# Patient Record
Sex: Female | Born: 2000 | Race: White | Hispanic: No | Marital: Single | Smoking: Never smoker
Health system: Southern US, Community
[De-identification: ages and names within clinical notes are randomized; demographics above are authoritative.]

## PROBLEM LIST (undated history)

## (undated) ENCOUNTER — Emergency Department (HOSPITAL_BASED_OUTPATIENT_CLINIC_OR_DEPARTMENT_OTHER): Admission: EM | Payer: Self-pay | Source: Home / Self Care

## (undated) DIAGNOSIS — F419 Anxiety disorder, unspecified: Secondary | ICD-10-CM

## (undated) DIAGNOSIS — F329 Major depressive disorder, single episode, unspecified: Secondary | ICD-10-CM

## (undated) DIAGNOSIS — F32A Depression, unspecified: Secondary | ICD-10-CM

## (undated) HISTORY — DX: Anxiety disorder, unspecified: F41.9

## (undated) HISTORY — DX: Depression, unspecified: F32.A

---
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
## 2022-02-16 ENCOUNTER — Telehealth: Payer: Self-pay

## 2022-02-16 ENCOUNTER — Telehealth (INDEPENDENT_AMBULATORY_CARE_PROVIDER_SITE_OTHER): Payer: 59 | Admitting: Child and Adolescent Psychiatry

## 2022-02-16 DIAGNOSIS — F3181 Bipolar II disorder: Secondary | ICD-10-CM | POA: Diagnosis not present

## 2022-02-16 DIAGNOSIS — F419 Anxiety disorder, unspecified: Secondary | ICD-10-CM

## 2022-02-16 MED ORDER — BUPROPION HCL ER (XL) 300 MG PO TB24
300.0000 mg | ORAL_TABLET | Freq: Every day | ORAL | 1 refills | Status: DC
Start: 2022-02-16 — End: 2022-06-01

## 2022-02-16 MED ORDER — ARIPIPRAZOLE 5 MG PO TABS: 5.0000 mg | ORAL_TABLET | Freq: Every day | ORAL | 1 refills | Status: AC

## 2022-02-16 MED ORDER — SERTRALINE HCL 100 MG PO TABS: 100.0000 mg | ORAL_TABLET | Freq: Every day | ORAL | 1 refills | Status: AC

## 2022-02-16 NOTE — Progress Notes (Signed)
Virtual Visit via Video Note   I connected with Tammy Norman on 02/16/22 at 11:00 AM EDT by a video enabled telemedicine application and verified that I am speaking with the correct person using two identifiers.  Location: Patient: home Provider: office   I discussed the limitations of evaluation and management by telemedicine and the availability of in person appointments. The patient expressed understanding and agreed to proceed.    I discussed the assessment and treatment plan with the patient. The patient was provided an opportunity to ask questions and all were answered. The patient agreed with the plan and demonstrated an understanding of the instructions.   The patient was advised to call back or seek an in-person evaluation if the symptoms worsen or if the condition fails to improve as anticipated.   Tammy Erm, MD   [facility] MD/PA/NP OP Progress Note  02/16/2022 11:16 AM [name]  MRN:  [id number]  Chief Complaint: "doing good.."(pt)  HPI:   This is a 21 year old female, rising sophomore at [employer], with no significant medical history and psychiatric history significant of bipolar 2 disorder and generalized anxiety disorder, was previously seeing Dr. Toy Care for medication management since 2020, presented to establish outpatient psychiatric treatment at this clinic in 05/2021 as Dr. Toy Care has left the practice.   She last saw Dr. Toy Care in 12/2020, she subsequently transitioned her treatment to this clinic in November 2022.  She was last seen about 6 weeks ago and at that time she was recommended to continue taking Zoloft 100 mg once a day, and Wellbutrin XL 300 mg once a day as well as Abilify 5 mg once a day.  Today she was accompanied with her mother in the car while they were driving back from her college to home.  Therefore she was evaluated jointly.  She denies any new concerns for today's appointment.  She reports that summer has been going well for  her, she has been relaxing and enjoying doing art/play video games/hanging out with friends.  She reports that she is moving back to campus this Friday and excited to start a new school year.  She denies excessive worries or anxiety, reports that her anxiety is around 4 out of 10, 10 being most anxious.  In regards to mood she denies any high highs or low lows, reports occasional low mood but denies any long periods of depressed mood.  She reports that she has been sleeping well, sleep has been restful, has decent energy, eats well, denies any SI or HI.  She reports that she has stayed consistent with her medications and denies any side effects from them.  She reports that she is here to do blood work because she did not receive the request.  Apparently the request for blood work was sent to wrong email address.  We will re sent to correct email id.  She continues to see her therapist once a week and will continue once the court starts.  We discussed to continue with current medications and follow back in about 6 to 8 weeks or earlier if needed.  Her mother was present and did not have any questions or concerns for patient.   Visit Diagnosis:    ICD-10-CM   1. Bipolar 2 disorder (HCC)  F31.81 ARIPiprazole (ABILIFY) 5 MG tablet    buPROPion (WELLBUTRIN XL) 300 MG 24 hr tablet    sertraline (ZOLOFT) 100 MG tablet    2. Anxiety  F41.9 sertraline (ZOLOFT) 100 MG tablet  Past Psychiatric History:   No previous inpatient psychiatric hospitalization. No medication trials other than Zoloft and Abilify. Was previously seeing outpatient psychotherapy [name] and restarting it next week. Has history of self-harm behaviors in the past, no previous suicide attempts.  Past Medical History:  Past Medical History:  Diagnosis Date   Anxiety    Depression    No past surgical history on file.  Family Psychiatric History:   Mother with history of anxiety.  Family History:  Family History   Problem Relation Age of Onset   Anxiety disorder Father     Social History:  Social History   Socioeconomic History   Marital status: Single    Spouse name: Not on file   Number of children: Not on file   Years of education: Not on file   Highest education level: 10th grade  Occupational History   Not on file  Tobacco Use   Smoking status: Never   Smokeless tobacco: Never  Vaping Use   Vaping Use: Never used  Substance and Sexual Activity   Alcohol use: Never   Drug use: Never   Sexual activity: Never  Other Topics Concern   Not on file  Social History Narrative   Not on file   Social Determinants of Health   Financial Resource Strain: Low Risk  (05/24/2019)   Overall Financial Resource Strain (CARDIA)    Difficulty of Paying Living Expenses: Not hard at all  Food Insecurity: No Food Insecurity (05/24/2019)   Hunger Vital Sign    Worried About Running Out of Food in the Last Year: Never true    [place] in the Last Year: Never true  Transportation Needs: No Transportation Needs (05/24/2019)   PRAPARE - Hydrologist (Medical): No    Lack of Transportation (Non-Medical): No  Physical Activity: Inactive (05/24/2019)   Exercise Vital Sign    Days of Exercise per Week: 0 days    Minutes of Exercise per Session: 0 min  Stress: No Stress Concern Present (05/24/2019)   [place]    Feeling of Stress : Only a little  Social Connections: Unknown (05/24/2019)   Social Connection and Isolation Panel [NHANES]    Frequency of Communication with Friends and Family: Not on file    Frequency of Social Gatherings with Friends and Family: Not on file    Attends Religious Services: Never    Marine scientist or Organizations: No    Attends Music therapist: Never    Marital Status: Never married    Allergies: No Known Allergies  Metabolic Disorder  Labs: No results found for: "HGBA1C", "MPG" No results found for: "PROLACTIN" No results found for: "CHOL", "TRIG", "HDL", "CHOLHDL", "VLDL", "LDLCALC" No results found for: "TSH"  Therapeutic Level Labs: No results found for: "LITHIUM" No results found for: "VALPROATE" No results found for: "CBMZ"  Current Medications: Current Outpatient Medications  Medication Sig Dispense Refill   ARIPiprazole (ABILIFY) 5 MG tablet Take 1 tablet (5 mg total) by mouth daily. 30 tablet 1   buPROPion (WELLBUTRIN XL) 300 MG 24 hr tablet Take 1 tablet (300 mg total) by mouth daily. 30 tablet 1   sertraline (ZOLOFT) 100 MG tablet Take 1 tablet (100 mg total) by mouth daily. 30 tablet 1   No current facility-administered medications for this visit.     Musculoskeletal: Strength & Muscle Tone: unable to assess since visit  was over the telemedicine.  Gait & Station: unable to assess since visit was over the telemedicine.  Patient leans: N/A  Psychiatric Specialty Exam: Review of Systems  There were no vitals taken for this visit.There is no height or weight on file to calculate BMI.  General Appearance: Casual and Fairly Groomed  Eye Contact:  Good  Speech:  Clear and Coherent and Normal Rate  Volume:  Normal  Mood:   "good"  Affect:  Appropriate, Congruent, and Restricted  Thought Process:  Goal Directed and Linear  Orientation:  Full (Time, Place, and Person)  Thought Content: Logical   Suicidal Thoughts:  No  Homicidal Thoughts:  No  Memory:  Immediate;   Fair Recent;   Fair Remote;   Fair  Judgement:  Fair  Insight:  Fair  Psychomotor Activity:  Normal  Concentration:  Concentration: Fair and Attention Span: Fair  Recall:  [employer] of Knowledge: Fair  Language: Fair  Akathisia:  No    AIMS (if indicated): not done   Assets:  Communication Skills Desire for Improvement Financial Resources/Insurance Housing Leisure Time Physical Health Social  Support Transportation Vocational/Educational  ADL's:  Intact  Cognition: WNL  Sleep:  Fair   Screenings: GAD-7    Flowsheet Row Video Visit from 12/28/2021 in [place]  Total GAD-7 Score 3      PHQ2-9    Flowsheet Row Video Visit from 12/28/2021 in [place] Video Visit from 11/15/2021 in [place] Video Visit from 05/26/2021 in [place]  PHQ-2 Total Score 0 4 4  PHQ-9 Total Score '2 13 13      '$ Flowsheet Row Video Visit from 05/26/2021 in [place] and Plan:   This is an 21 year old female with bipolar 2 disorder and generalized anxiety disorder presents today for follow up.  She was previously seeing Dr. Toy Care at our [facility] office and last saw her in June of this year.  She is currently prescribed Zoloft 100 mg once a day, Wellbutrin XL 300 mg daily and Abilify 5 mg once a day.  Appears to have continued stability with mood and anxiety, recommending to continue with current treatment.  Continues to see her therapist about once a week.  Ordered metabolic panel since she has been on Abilify, they will do it before the next appointment.   Plan as below.  1. Bipolar 2 disorder (HCC)  - ARIPiprazole (ABILIFY) 5 MG tablet; Take 1 tablet (5 mg total) by mouth daily.  Dispense: 30 tablet; Refill: 1 - buPROPion (WELLBUTRIN XL) 300 MG 24 hr tablet; Take 1 tablet (300 mg total) by mouth daily.  Dispense: 30 tablet; Refill: 1 - sertraline (ZOLOFT) 100 MG tablet; Take 1 tablet (100 mg total) by mouth daily.  Dispense: 30 tablet; Refill: 1  2. Anxiety  - sertraline (ZOLOFT) 100 MG tablet; Take 1 tablet (100 mg total) by mouth daily.  Dispense: 30 tablet; Refill: 1  - Continue Ind therapy with [name].    MDM = 2 or more chronic stable conditions + med  management   This note was generated in part or whole with voice recognition software. Voice recognition is usually quite accurate but there are transcription errors that can and very often do occur. I apologize for any typographical errors that were not detected and corrected.    Tammy Erm, MD  02/16/2022, 11:16 AM

## 2022-02-16 NOTE — Telephone Encounter (Signed)
copy of the labwork orders were emailed to patient. also made a copy of the labworks and mailed out the orginial.

## 2022-02-17 ENCOUNTER — Telehealth: Payer: Self-pay

## 2022-02-24 ENCOUNTER — Telehealth: Payer: 59 | Admitting: Child and Adolescent Psychiatry

## 2022-02-24 NOTE — Telephone Encounter (Signed)
Error

## 2022-03-02 ENCOUNTER — Encounter (HOSPITAL_COMMUNITY): Payer: Self-pay | Admitting: Psychiatry

## 2022-03-02 ENCOUNTER — Telehealth: Payer: Self-pay

## 2022-03-02 NOTE — Telephone Encounter (Signed)
labwork orders were emailed and mailed.  

## 2022-03-10 ENCOUNTER — Other Ambulatory Visit (HOSPITAL_COMMUNITY): Payer: Self-pay | Admitting: Psychiatry

## 2022-03-10 DIAGNOSIS — F419 Anxiety disorder, unspecified: Secondary | ICD-10-CM

## 2022-04-08 ENCOUNTER — Telehealth: Payer: 59 | Admitting: Child and Adolescent Psychiatry

## 2022-04-08 ENCOUNTER — Telehealth: Payer: Self-pay | Admitting: Child and Adolescent Psychiatry

## 2022-04-08 NOTE — Telephone Encounter (Signed)
Pt was sent link via text and email to connect on video for telemedicine encounter for scheduled appointment, and was also followed up with phone call. Pt did not connect on the video, and writer left the VM requesting to connect on the video or call back to reschedule appointment if they are not able to connect today for appointment.   

## 2022-06-01 ENCOUNTER — Other Ambulatory Visit: Payer: Self-pay | Admitting: Child and Adolescent Psychiatry

## 2022-06-01 DIAGNOSIS — F3181 Bipolar II disorder: Secondary | ICD-10-CM

## 2022-06-02 ENCOUNTER — Telehealth: Payer: Self-pay | Admitting: Child and Adolescent Psychiatry

## 2022-06-02 NOTE — Telephone Encounter (Signed)
Left message for patient to schedule follow up appointment with Dr Pricilla Larsson to get refills if she is still interested.
